# Patient Record
Sex: Female | Born: 1990 | Race: White | Hispanic: No | Marital: Single | Smoking: Never smoker
Health system: Southern US, Community
[De-identification: ages and names within clinical notes are randomized; demographics above are authoritative.]

---

## 2018-02-28 ENCOUNTER — Emergency Department (HOSPITAL_COMMUNITY): Payer: BLUE CROSS/BLUE SHIELD

## 2018-02-28 ENCOUNTER — Emergency Department (HOSPITAL_COMMUNITY)
Admission: EM | Admit: 2018-02-28 | Discharge: 2018-03-01 | Disposition: A | Discharge status: Home / Self Care | Payer: BLUE CROSS/BLUE SHIELD | Attending: Emergency Medicine | Admitting: Emergency Medicine

## 2018-02-28 ENCOUNTER — Encounter (HOSPITAL_COMMUNITY): Payer: Self-pay | Admitting: Emergency Medicine

## 2018-02-28 DIAGNOSIS — O209 Hemorrhage in early pregnancy, unspecified: Secondary | ICD-10-CM | POA: Diagnosis present

## 2018-02-28 DIAGNOSIS — O9989 Other specified diseases and conditions complicating pregnancy, childbirth and the puerperium: Secondary | ICD-10-CM | POA: Insufficient documentation

## 2018-02-28 DIAGNOSIS — O039 Complete or unspecified spontaneous abortion without complication: Secondary | ICD-10-CM | POA: Insufficient documentation

## 2018-02-28 DIAGNOSIS — Z3A00 Weeks of gestation of pregnancy not specified: Secondary | ICD-10-CM | POA: Diagnosis not present

## 2018-02-28 DIAGNOSIS — R102 Pelvic and perineal pain: Secondary | ICD-10-CM | POA: Insufficient documentation

## 2018-02-28 LAB — CBC WITH DIFFERENTIAL/PLATELET
BASOS ABS: 0 10*3/uL (ref 0.0–0.1)
BASOS PCT: 0 %
EOS ABS: 0 10*3/uL (ref 0.0–0.7)
EOS PCT: 0 %
HCT: 39.8 % (ref 36.0–46.0)
Hemoglobin: 13.7 g/dL (ref 12.0–15.0)
Lymphocytes Relative: 15 %
Lymphs Abs: 1.9 10*3/uL (ref 0.7–4.0)
MCH: 29.9 pg (ref 26.0–34.0)
MCHC: 34.4 g/dL (ref 30.0–36.0)
MCV: 86.9 fL (ref 78.0–100.0)
MONO ABS: 0.7 10*3/uL (ref 0.1–1.0)
Monocytes Relative: 6 %
Neutro Abs: 9.8 10*3/uL — ABNORMAL HIGH (ref 1.7–7.7)
Neutrophils Relative %: 79 %
PLATELETS: 277 10*3/uL (ref 150–400)
RBC: 4.58 MIL/uL (ref 3.87–5.11)
RDW: 11.9 % (ref 11.5–15.5)
WBC: 12.4 10*3/uL — ABNORMAL HIGH (ref 4.0–10.5)

## 2018-02-28 LAB — COMPREHENSIVE METABOLIC PANEL
ALBUMIN: 3.3 g/dL — AB (ref 3.5–5.0)
ALT: 16 U/L (ref 14–54)
ANION GAP: 7 (ref 5–15)
AST: 18 U/L (ref 15–41)
Alkaline Phosphatase: 82 U/L (ref 38–126)
BUN: 8 mg/dL (ref 6–20)
CHLORIDE: 108 mmol/L (ref 101–111)
CO2: 24 mmol/L (ref 22–32)
Calcium: 8.9 mg/dL (ref 8.9–10.3)
Creatinine, Ser: 0.51 mg/dL (ref 0.44–1.00)
GFR calc Af Amer: >60 mL/min (ref >60)
GFR calc non Af Amer: >60 mL/min (ref >60)
GLUCOSE: 96 mg/dL (ref 65–99)
Potassium: 3.7 mmol/L (ref 3.5–5.1)
SODIUM: 139 mmol/L (ref 135–145)
Total Bilirubin: 0.8 mg/dL (ref 0.3–1.2)
Total Protein: 6.8 g/dL (ref 6.5–8.1)

## 2018-02-28 LAB — HCG, QUANTITATIVE, PREGNANCY: HCG, BETA CHAIN, QUANT, S: 5500 m[IU]/mL — AB (ref <5)

## 2018-02-28 NOTE — ED Triage Notes (Signed)
Patient here from home with complaints of vaginal bleeding. Reports that she was [redacted] weeks pregnant and OB told her that baby did not have a heartbeat and would start to bleed and pass fetus. Abdominal cramping.

## 2018-02-28 NOTE — ED Notes (Signed)
US at bedside

## 2018-02-28 NOTE — ED Provider Notes (Signed)
[facility]-EMERGENCY DEPT Provider Note   CSN: [id number] Arrival date & time: 02/28/18  2045     History   Chief Complaint Chief Complaint  Patient presents with  . Vaginal Bleeding  . Miscarriage    HPI Lisa Carroll is a [id number] 27 y.o. female with no pertinent past medical history who presents to the emergency department with a chief complaint of vaginal bleeding.  The patient reports that she was seen by her OB/GYN in [place] 2 weeks ago and was told that her baby did not have a heartbeat.  She followed up earlier this week for a recheck and was again told that the baby did not have a heartbeat.  She was offered misoprostol, which she declined and felt that she would miscarry naturally.  She reports that 4 days ago she began to have some light brown spotting.  For the last 2 days, she has had some bright red blood consistent with the amount of bleeding she has had in her previous periods.  She reports that from 8-5 today, she had a small amount of bleeding and only used 1 pad; however, she is now gone through 5 pads in the last 4 hour.  She reports associated abdominal cramping, nausea, chills, and one episode of emesis.     She reports that she became concerned about the amount of bleeding because she did not feel that she received a lot of information about the miscarriage would occur so she called her OB/GYN's office.  She reports that she was told to come to the emergency department if she began bleeding through more than 1 pad an hour.  She reports that she just went to the restroom and passed a clot of blood that looked at approximately 5 to 6 cm.  She reports the nausea and chills have resolved. She is continued to have some lower abdominal cramping.  She denies fever, dyspnea, lightheadedness, pallor,syncope, or chest pain.   The history is provided by the patient. No language interpreter was used.    History reviewed. No pertinent past  medical history.  There are no active problems to display for this patient.   History reviewed. No pertinent surgical history.   OB History   None      Home Medications    Prior to Admission medications   Medication Sig Start Date End Date Taking? Authorizing Provider  Prenatal Vit-Fe Fumarate-FA (MULTIVITAMIN-PRENATAL) 27-0.8 MG TABS tablet Take 1 tablet by mouth daily at 12 noon.   Yes [provider]    Family History No family history on file.  Social History Social History   Tobacco Use  . Smoking status: Never Smoker  . Smokeless tobacco: Never Used  Substance Use Topics  . Alcohol use: Not on file  . Drug use: Not on file     Allergies   Codeine and Sulfa antibiotics   Review of Systems Review of Systems  Constitutional: Positive for chills. Negative for activity change and fever.  Respiratory: Negative for shortness of breath.   Cardiovascular: Negative for chest pain.  Gastrointestinal: Positive for abdominal pain, nausea and vomiting.  Genitourinary: Positive for vaginal bleeding. Negative for dysuria and vaginal pain.  Musculoskeletal: Negative for back pain.  Skin: Negative for rash.  Allergic/Immunologic: Negative for immunocompromised state.  Neurological: Negative for headaches.  Psychiatric/Behavioral: Negative for confusion.     Physical Exam Updated Vital Signs BP 128/76 (BP Location: Left Arm)   Pulse 85   Temp 98.4 F (  36.9 C) (Oral)   Resp 15   Ht 5\' 9"  (1.753 m)   Wt 99.3 kg (219 lb)   SpO2 100%   BMI 32.34 kg/m   Physical Exam  Constitutional: No distress.  HENT:  Head: Normocephalic.  Eyes: Conjunctivae are normal.  Neck: Neck supple.  Cardiovascular: Normal rate, regular rhythm, normal heart sounds and intact distal pulses. Exam reveals no gallop and no friction rub.  No murmur heard. Pulmonary/Chest: Effort normal. No stridor. No respiratory distress. She has no wheezes. She has no rales. She exhibits no  tenderness.  Abdominal: Soft. Bowel sounds are normal. She exhibits no distension and no mass. There is tenderness. There is no rebound and no guarding. No hernia.  Minimally TTP in bilateral lower quadrants without rebound or guarding.  Genitourinary:  Genitourinary Comments: Chaperoned exam.  Several large clots are present in the vaginal vault.  Cervix is open.  She continues to have small amounts of blood on exam coming through the cervix.  No cervical motion tenderness.  Neurological: She is alert.  Skin: Skin is warm. No rash noted.  Psychiatric: Her behavior is normal.  Nursing note and vitals reviewed.  ED Treatments / Results  Labs (all labs ordered are listed, but only abnormal results are displayed) Labs Reviewed  HCG, QUANTITATIVE, PREGNANCY - Abnormal; Notable for the following components:      Result Value   hCG, Beta Chain, Quant, S 5,500 (*)    All other components within normal limits  CBC WITH DIFFERENTIAL/PLATELET - Abnormal; Notable for the following components:   WBC 12.4 (*)    Neutro Abs 9.8 (*)    All other components within normal limits  COMPREHENSIVE METABOLIC PANEL - Abnormal; Notable for the following components:   Albumin 3.3 (*)    All other components within normal limits    EKG None  Radiology No results found.  Procedures Procedures (including critical care time)  Medications Ordered in ED Medications - No data to display   Initial Impression / Assessment and Plan / ED Course  I have reviewed the triage vital signs and the nursing notes.  Pertinent labs & imaging results that were available during my care of the patient were reviewed by me and considered in my medical decision making (see chart for details).     Lisa Carroll is a 151P0000 27 y.o. female with no pertinent past medical history who presents to the emergency department with a chief complaint of vaginal bleeding.  She was  informed of fetal demise by her OB/GYN.  On exam,  there are several large clots in the vaginal vault, which I have evacuated.  Cervix is open.  No cervical motion or adnexal tenderness.  Hemoglobin is stable at 13.7.  Quantitative hCG 5500.  Labs are otherwise unremarkable.  Pelvic ultrasound is pending. [facility] at the end of my shift. Patient presentation, ED course, and plan of care discussed with review of all pertinent labs and imaging. Please see his/her note for further details regarding further ED course and disposition.  Final Clinical Impressions(s) / ED Diagnoses   Final diagnoses:  None    ED Discharge Orders    None       Barkley BoardsMcDonald, Ahri Olson A, PA-C 03/01/18 0016    Lorre NickAllen, Anthony, MD 03/01/18 2021

## 2018-03-01 LAB — CBC WITH DIFFERENTIAL/PLATELET
BASOS PCT: 0 %
Basophils Absolute: 0 10*3/uL (ref 0.0–0.1)
EOS ABS: 0 10*3/uL (ref 0.0–0.7)
EOS PCT: 0 %
HCT: 39.9 % (ref 36.0–46.0)
Hemoglobin: 13.7 g/dL (ref 12.0–15.0)
Lymphocytes Relative: 24 %
Lymphs Abs: 2.8 10*3/uL (ref 0.7–4.0)
MCH: 29.7 pg (ref 26.0–34.0)
MCHC: 34.3 g/dL (ref 30.0–36.0)
MCV: 86.4 fL (ref 78.0–100.0)
MONO ABS: 0.8 10*3/uL (ref 0.1–1.0)
Monocytes Relative: 7 %
Neutro Abs: 8 10*3/uL — ABNORMAL HIGH (ref 1.7–7.7)
Neutrophils Relative %: 69 %
Platelets: 304 10*3/uL (ref 150–400)
RBC: 4.62 MIL/uL (ref 3.87–5.11)
RDW: 12 % (ref 11.5–15.5)
WBC: 11.7 10*3/uL — ABNORMAL HIGH (ref 4.0–10.5)

## 2018-03-01 NOTE — Discharge Instructions (Signed)
You must be seen in 48 hours for recheck of your pregnancy hormone.  Your current pregnancy hormone level (hCG) is 5500.  We would expect this to be trending down.  If you have worsening pain, increased bleeding, lightheadedness or shortness of breath please return to the emergency department.

## 2018-03-01 NOTE — ED Provider Notes (Signed)
Patient signed out to me at shift change.  Patient recently seen and [place] for fetal demise.  Ultrasound shows no discrete IUP or adnexal masses.  Differential includes IUP too early to visualize, recent spontaneous abortion, or possibly occult ectopic pregnancy.  Given her known history, spontaneous abortion is most likely.  I have given the patient instructions to follow-up with OB/GYN in 48 hours for repeat hCG.  Return precautions have been given.  Hemoglobin has been stable throughout her emergency department stay today.  She is reassured.  She is stable for discharge.   Roxy HorsemanBrowning, Ting Cage, PA-C 03/01/18 0149    Lorre NickAllen, Anthony, MD 03/01/18 2021

## 2019-07-15 IMAGING — US US OB TRANSVAGINAL
1 series · 13 of 28 positions shown · non-contrast
Comparison: None.

CLINICAL DATA: Initial evaluation for vaginal bleeding, early
pregnancy.

EXAM:
OBSTETRIC <14 WK US AND TRANSVAGINAL OB US
TECHNIQUE: Both transabdominal and transvaginal ultrasound examinations were
performed for complete evaluation of the gestation as well as the
maternal uterus, adnexal regions, and pelvic cul-de-sac.
Transvaginal technique was performed to assess early pregnancy.

[Series 1: us ob transvaginal · 86 acquisitions, 13 frames shown]
[im 4/86]
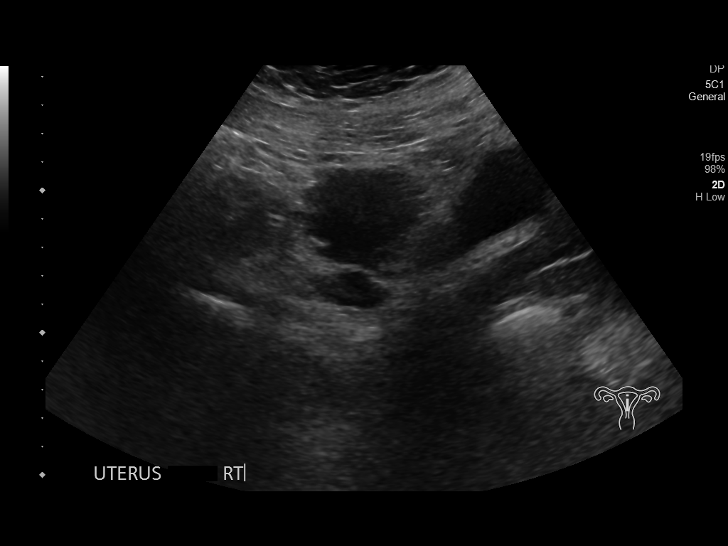
[im 10/86]
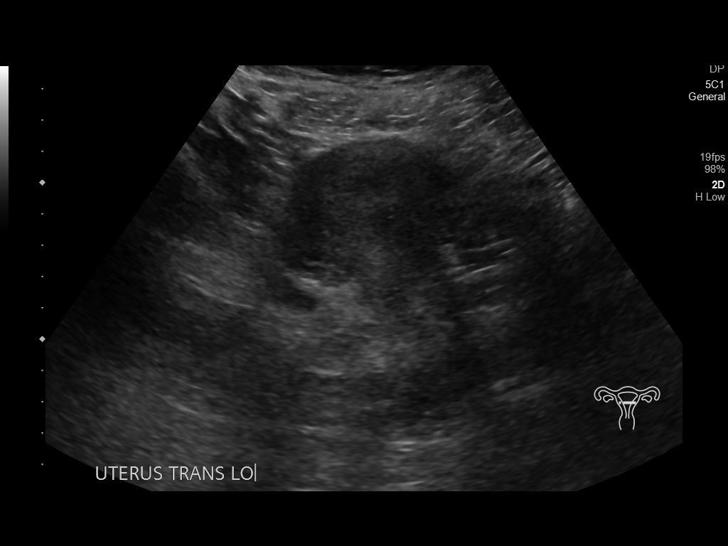
[im 16/86]
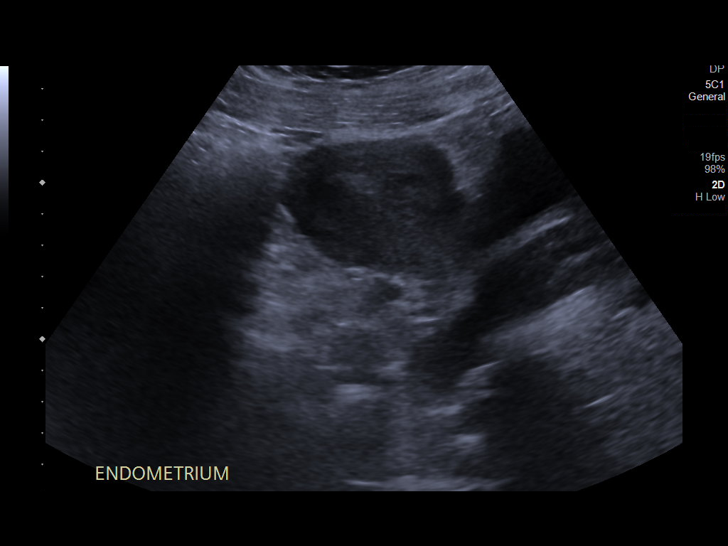
[im 23/86]
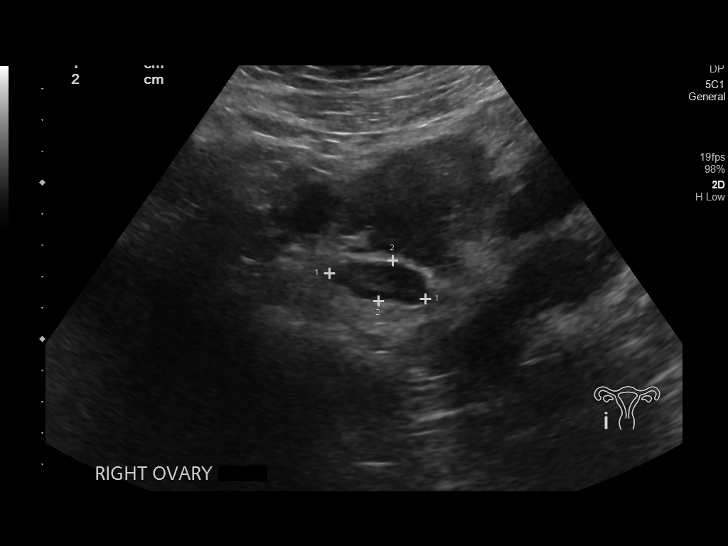
[im 29/86]
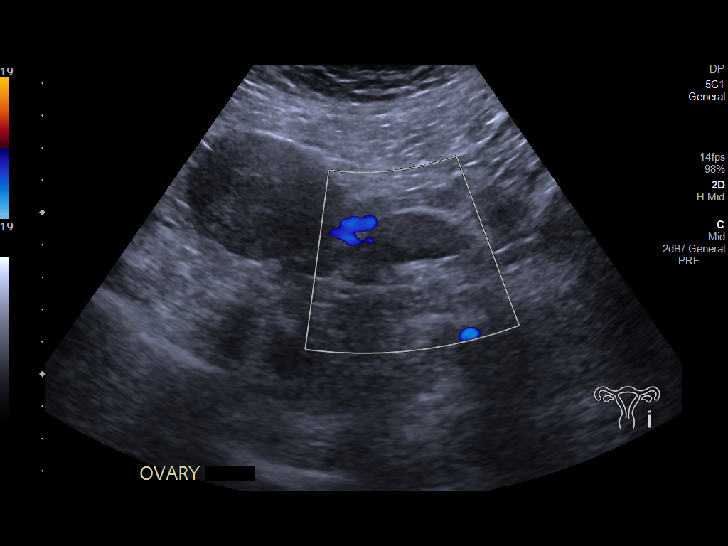
[im 35/86]
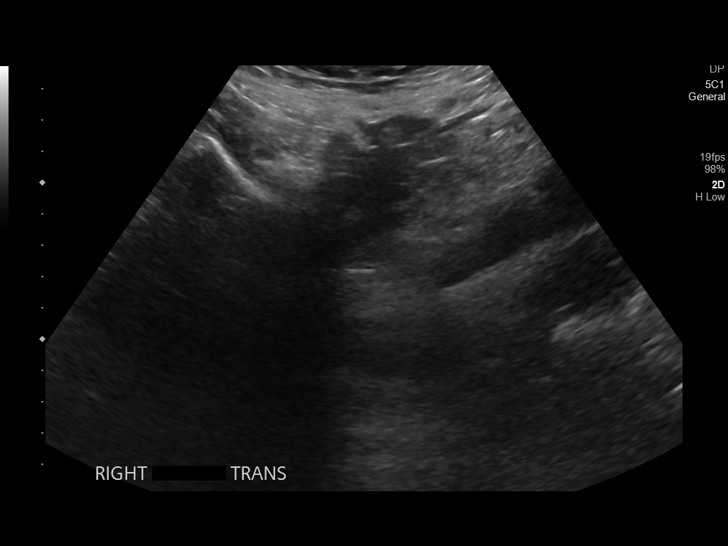
[im 45/86]
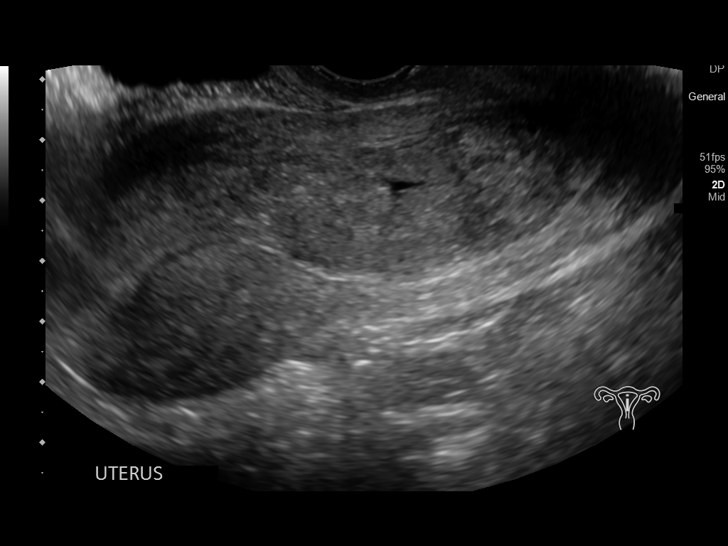
[im 51/86]
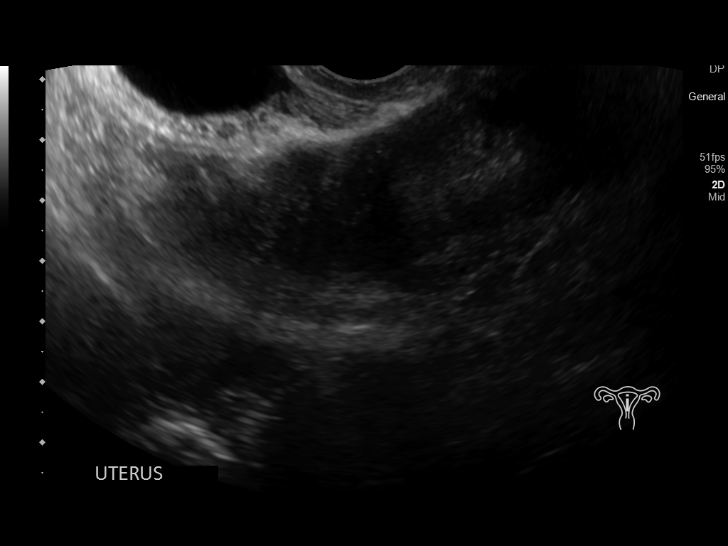
[im 57/86]
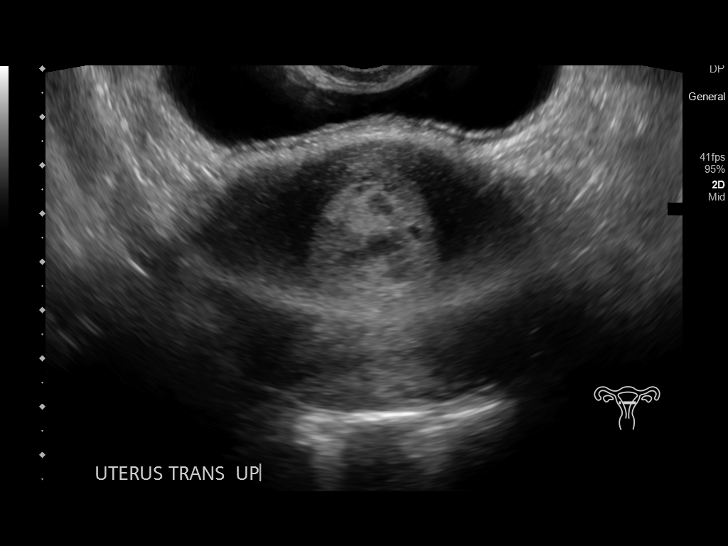
[im 63/86]
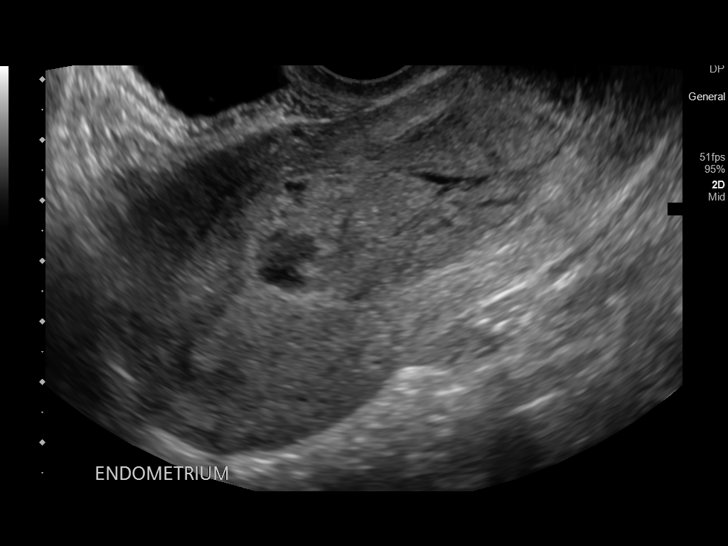
[im 70/86]
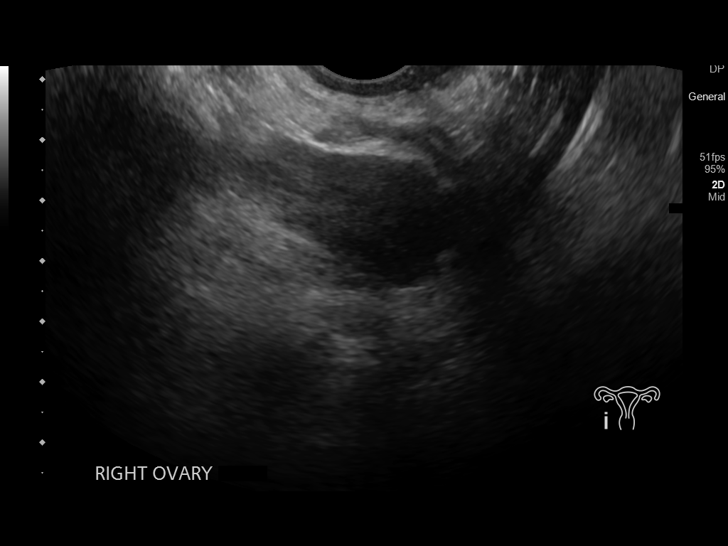
[im 76/86]
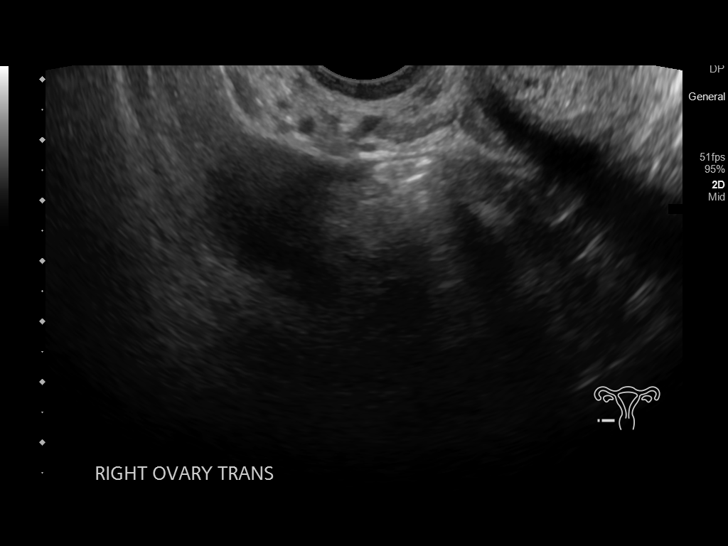
[im 82/86]
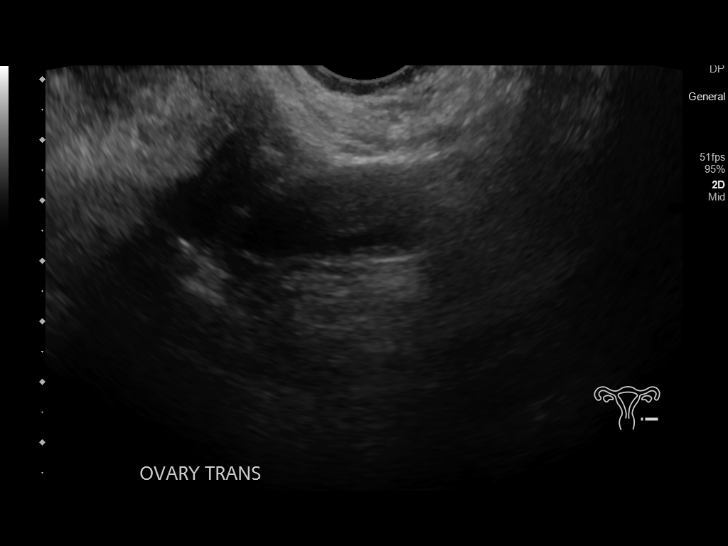

[13 of 28 positions shown; findings below may reference images not displayed]

FINDINGS: Intrauterine gestational sac: Negative. Endometrial stripe complex
and heterogeneous in appearance, measuring up to 22 mm in thickness.
No associated vascularity.

Yolk sac:  Negative.

Embryo:  Negative.

Cardiac Activity: N/A

Heart Rate: N/A  bpm

Subchorionic hemorrhage:  None visualized.

Maternal uterus/adnexae: Ovaries normal in appearance bilaterally.
No adnexal mass. No free fluid within the pelvis.
IMPRESSION: 1. Early pregnancy with no discrete IUP or adnexal mass identified.
Finding is consistent with a pregnancy of unknown anatomic location.
Differential considerations include IUP to early to visualize,
recent SAB, or possibly occult ectopic pregnancy. Close clinical
monitoring with serial beta HCGs and close interval follow-up
ultrasound recommended as clinically warranted.
2. No other acute maternal or uterine adnexal abnormality.
